# Patient Record
Sex: Male | Born: 2000 | Race: Black or African American | Hispanic: No | Marital: Single | Smoking: Never smoker
Health system: Southern US, Community
[De-identification: ages and names within clinical notes are randomized; demographics above are authoritative.]

## PROBLEM LIST (undated history)

## (undated) HISTORY — PX: OTHER SURGICAL HISTORY: SHX169

---
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
## 2020-11-08 IMAGING — CR DG ELBOW 2V*R*
1 series · 1 of 1 positions shown · non-contrast
Comparison: Earlier same day

CLINICAL DATA: Post reduction of right elbow dislocation.

EXAM:
RIGHT ELBOW - 2 VIEW

[lat]
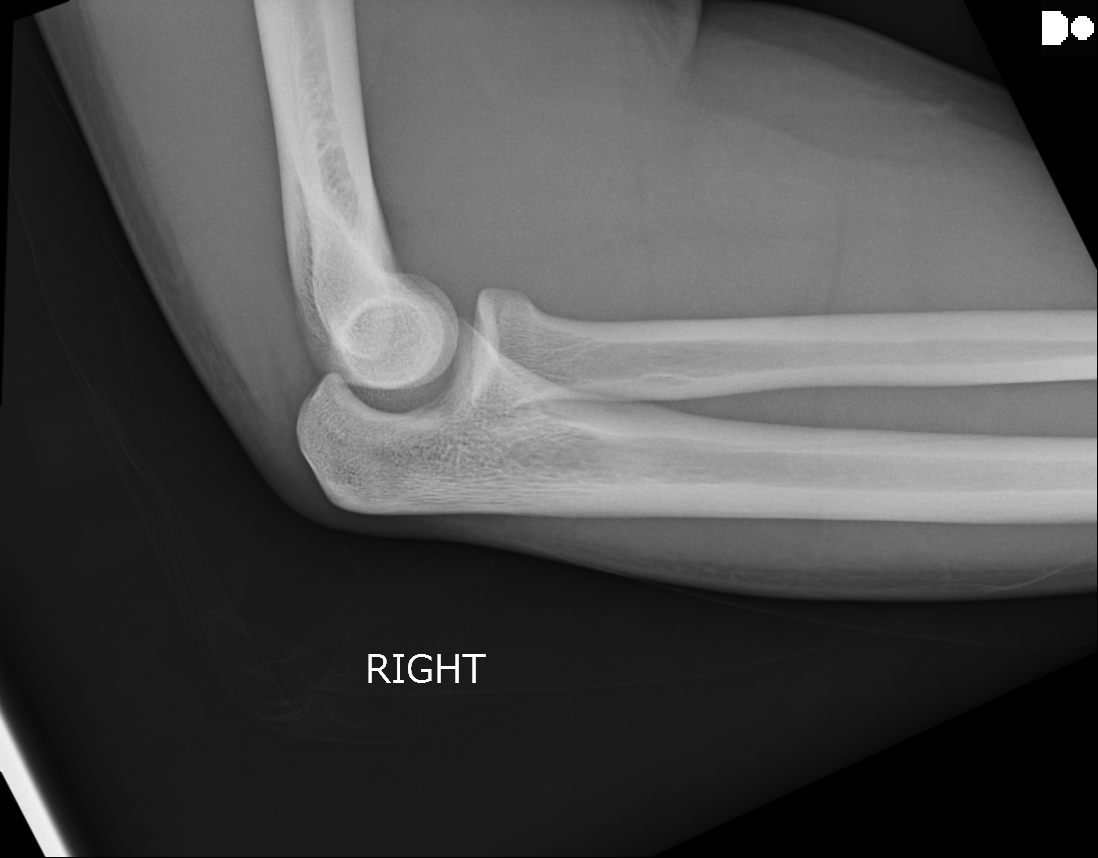

[1 of 1 positions shown; findings below may reference images not displayed]

FINDINGS: Interval reduction of previously identified elbow dislocation
without definite evidence of fracture given solitary lateral
projection. No elbow joint effusion. Alignment appears anatomic with
normal orientation of the anterior humeral line as well as the
radiocapitellar articulation. Regional soft tissues appear normal.
IMPRESSION: Interval reduction of right elbow dislocation without evidence of
fracture or elbow joint effusion.

## 2020-11-08 IMAGING — DX DG ELBOW 2V*R*
2 series · 2 of 2 positions shown · non-contrast
Comparison: None.

CLINICAL DATA: Person fell on arm.  Deformity.

EXAM:
RIGHT ELBOW - 2 VIEW

[elbow ap]
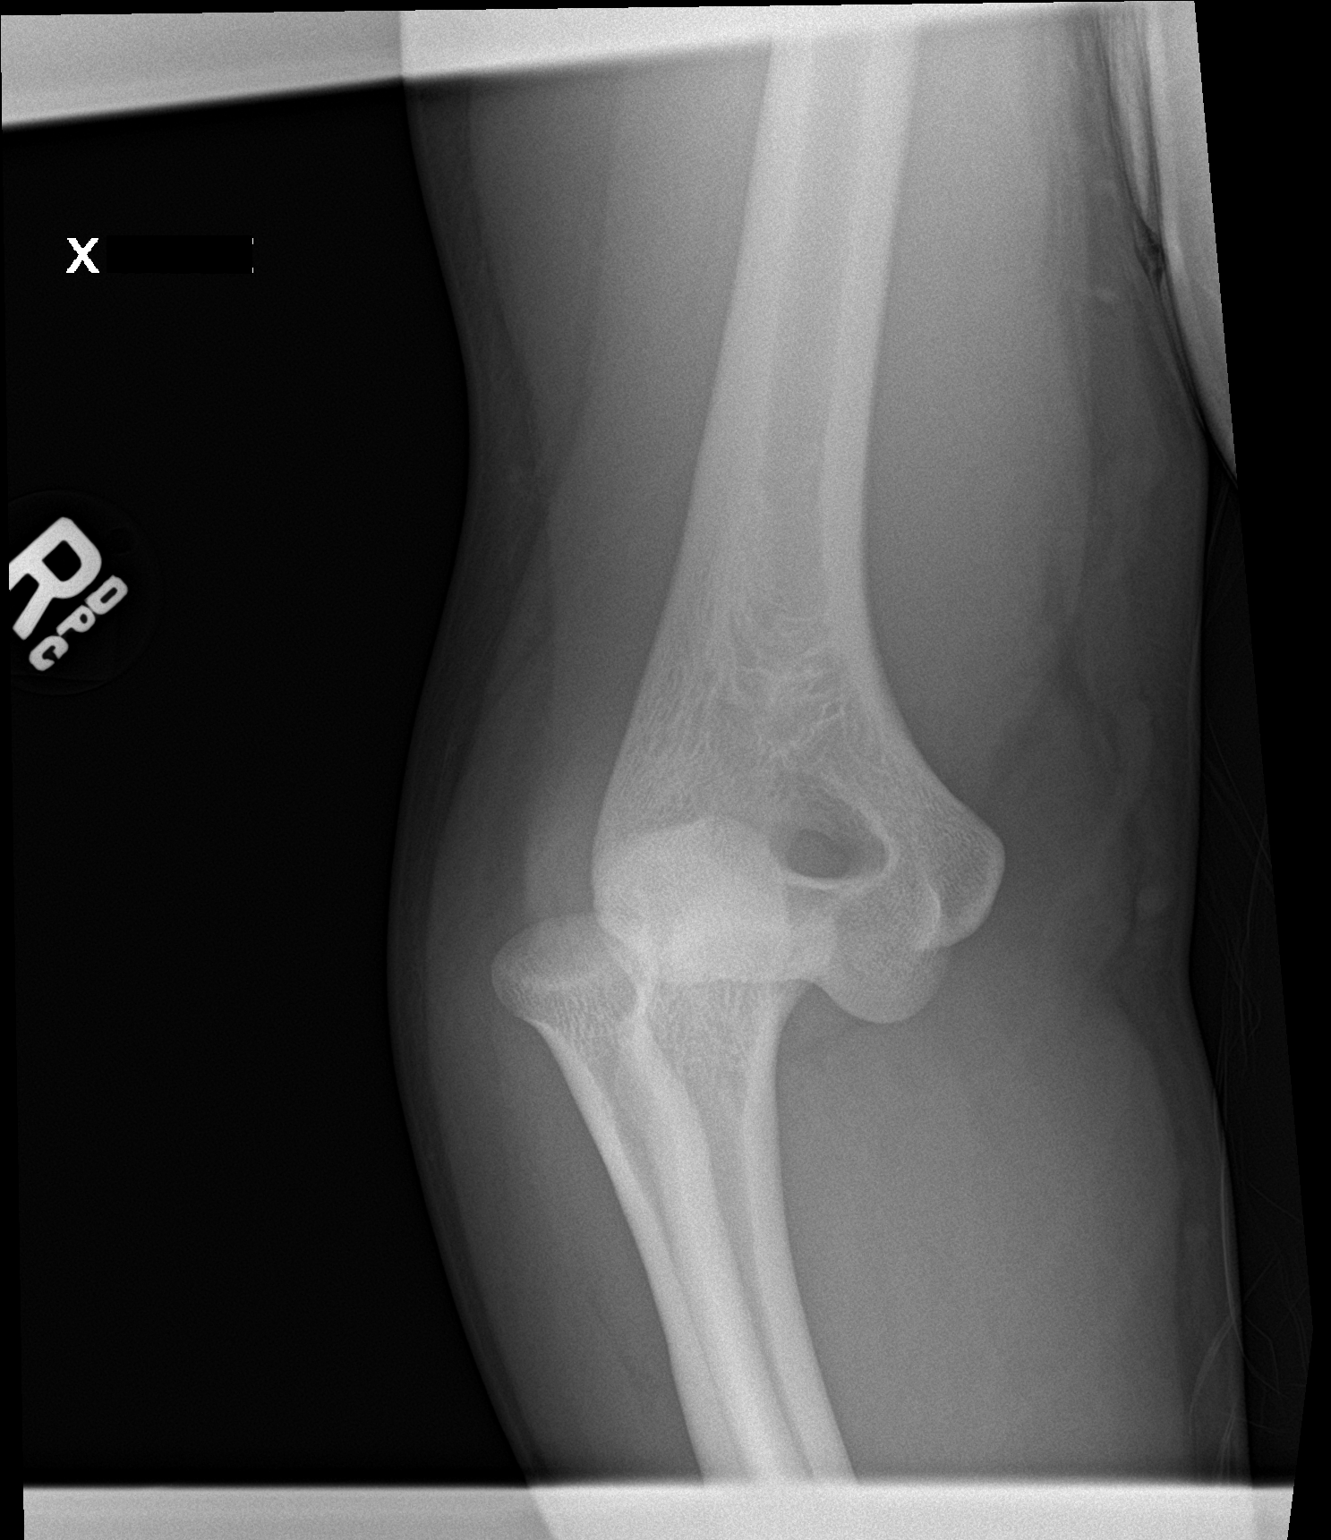

[elbow lat]
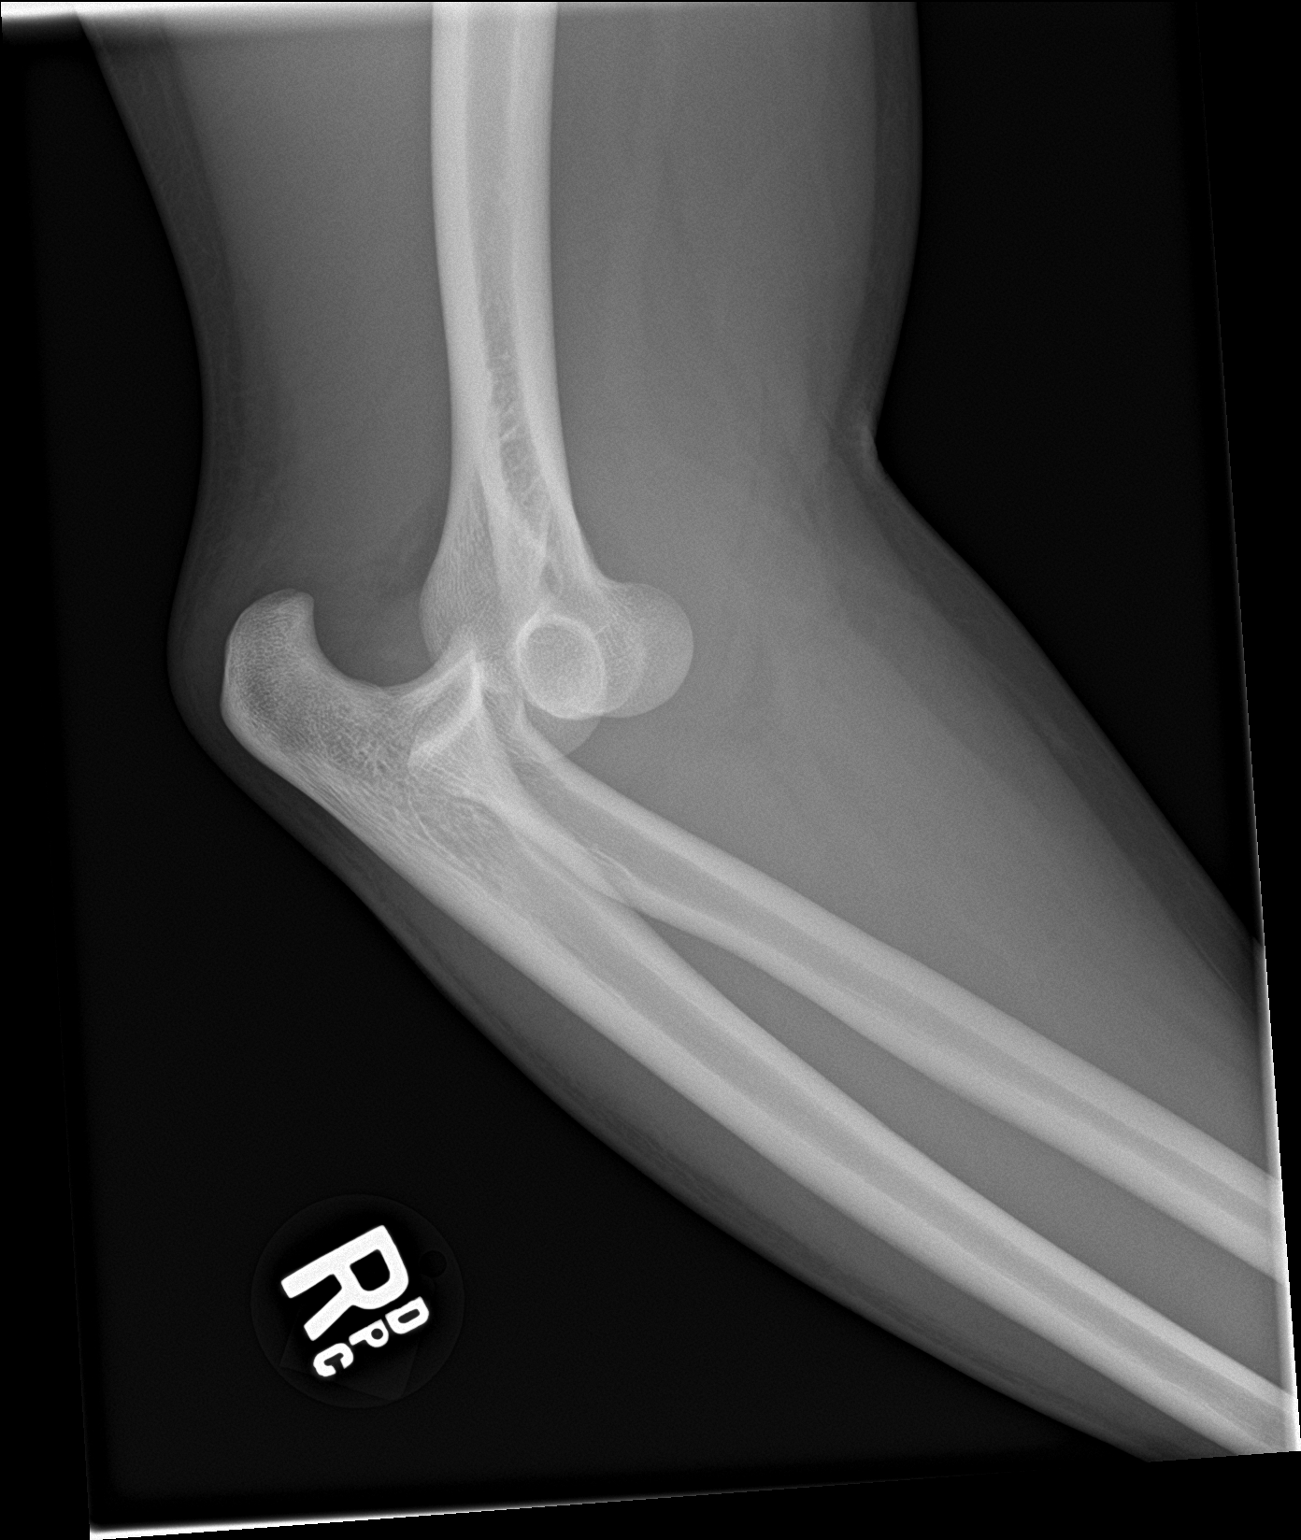

[2 of 2 positions shown; findings below may reference images not displayed]

FINDINGS: Elbow dislocation with posterior displacement of the radius and
ulna. No fracture deformity. No destructive bony lesions. Small
elbow effusion.
IMPRESSION: Elbow dislocation without fracture deformity.

## 2021-04-18 ENCOUNTER — Encounter: Payer: Self-pay | Admitting: Pediatrics

## 2021-10-14 ENCOUNTER — Emergency Department (HOSPITAL_COMMUNITY)
Admission: EM | Admit: 2021-10-14 | Discharge: 2021-10-14 | Disposition: A | Discharge status: Home / Self Care | Payer: Medicaid Other | Attending: Emergency Medicine | Admitting: Emergency Medicine

## 2021-10-14 ENCOUNTER — Encounter (HOSPITAL_COMMUNITY): Payer: Self-pay

## 2021-10-14 ENCOUNTER — Other Ambulatory Visit: Payer: Self-pay

## 2021-10-14 ENCOUNTER — Emergency Department (HOSPITAL_COMMUNITY): Payer: Medicaid Other

## 2021-10-14 DIAGNOSIS — S76302A Unspecified injury of muscle, fascia and tendon of the posterior muscle group at thigh level, left thigh, initial encounter: Secondary | ICD-10-CM | POA: Diagnosis not present

## 2021-10-14 DIAGNOSIS — Y9372 Activity, wrestling: Secondary | ICD-10-CM | POA: Insufficient documentation

## 2021-10-14 DIAGNOSIS — W2189XA Striking against or struck by other sports equipment, initial encounter: Secondary | ICD-10-CM | POA: Insufficient documentation

## 2021-10-14 DIAGNOSIS — S8992XA Unspecified injury of left lower leg, initial encounter: Secondary | ICD-10-CM | POA: Diagnosis present

## 2021-10-14 MED ORDER — IBUPROFEN 800 MG PO TABS: 800.0000 mg | ORAL_TABLET | Freq: Three times a day (TID) | ORAL | 1 refills | Status: AC | PRN
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
## 2021-10-14 NOTE — ED Provider Notes (Signed)
[facility] EMERGENCY DEPARTMENT Provider Note   CSN: [id number] Arrival date & time: 10/14/21  2050     History  Chief Complaint  Patient presents with   Knee Pain    "From wrestling at home"    Luis Sosa is a 21 y.o. male.  Patient was wrestling and injured his left hamstring.  No past medical history  The history is provided by the patient and medical records. No language interpreter was used.  Knee Pain Lower extremity pain location: Left leg. Injury: yes   Pain details:    Quality:  Aching   Radiates to:  Does not radiate   Severity:  Moderate   Timing:  Constant   Progression:  Worsening Chronicity:  New Dislocation: no   Foreign body present:  No foreign bodies Associated symptoms: no back pain and no fatigue       Home Medications Prior to Admission medications   Medication Sig Start Date End Date Taking? Authorizing Provider  ibuprofen (ADVIL) 800 MG tablet Take 1 tablet (800 mg total) by mouth every 8 (eight) hours as needed for moderate pain. 10/14/21  Yes Milton Ferguson, MD  HYDROcodone-acetaminophen (NORCO/VICODIN) 5-325 MG tablet Take 1 tablet by mouth every 4 (four) hours as needed. 09/18/18   Isla Pence, MD      Allergies    Patient has no known allergies.    Review of Systems   Review of Systems  Constitutional:  Negative for appetite change and fatigue.  HENT:  Negative for congestion, ear discharge and sinus pressure.   Eyes:  Negative for discharge.  Respiratory:  Negative for cough.   Cardiovascular:  Negative for chest pain.  Gastrointestinal:  Negative for abdominal pain and diarrhea.  Genitourinary:  Negative for frequency and hematuria.  Musculoskeletal:  Negative for back pain.       Left leg pain  Skin:  Negative for rash.  Neurological:  Negative for seizures and headaches.  Psychiatric/Behavioral:  Negative for hallucinations.    Physical Exam Updated Vital Signs BP 100/73 (BP Location: Right Arm)    Pulse (!) 123     Temp 98.8 F (37.1 C) (Oral)    Resp 18    Ht 5\' 7"  (1.702 m)    Wt 90.3 kg    SpO2 97%    BMI 31.17 kg/m  Physical Exam Vitals and nursing note reviewed.  Constitutional:      Appearance: He is well-developed.  HENT:     Head: Normocephalic.     Mouth/Throat:     Mouth: Mucous membranes are moist.  Eyes:     Conjunctiva/sclera: Conjunctivae normal.  Neck:     Trachea: No tracheal deviation.  Cardiovascular:     Rate and Rhythm: Normal rate.     Heart sounds: No murmur heard. Pulmonary:     Effort: Pulmonary effort is normal.  Musculoskeletal:        General: Normal range of motion.     Comments: Pain in left hamstring  Skin:    General: Skin is warm.  Neurological:     Mental Status: He is alert and oriented to person, place, and time.    ED Results / Procedures / Treatments   Labs (all labs ordered are listed, but only abnormal results are displayed) Labs Reviewed - No data to display  EKG None  Radiology DG Knee Complete 4 Views Left  Result Date: 10/14/2021 CLINICAL DATA:  Injury EXAM: LEFT KNEE - COMPLETE 4+ VIEW COMPARISON:  None. FINDINGS:  No evidence of fracture, dislocation, or joint effusion. No evidence of arthropathy or other focal bone abnormality. Soft tissues are unremarkable. IMPRESSION: Negative. Electronically Signed   By: Donavan Foil M.D.   On: 10/14/2021 21:33    Procedures Procedures    Medications Ordered in ED Medications - No data to display  ED Course/ Medical Decision Making/ A&P                           Medical Decision Making  Patient with strain left hamstring.  He is given crutches and Motrin will follow-up with a family doctor or an orthopedic This patient presents to the ED for concern of left leg pain, this involves an extensive number of treatment options, and is a complaint that carries with it a high risk of complications and morbidity.  The differential diagnosis includes injury left hamstring, fractured femur, blood clot  in leg,   Co morbidities that complicate the patient evaluation  None   Additional history obtained:  Additional history obtained from patient External records from outside source obtained and reviewed including no hospital records   Lab Tests:  I Ordered, and personally interpreted labs.  The pertinent results include: A lab test   Imaging Studies ordered:  I ordered imaging studies including x-ray of femur I independently visualized and interpreted imaging which showed unremarkable not on the monitor I agree with the radiologist interpretation   Cardiac Monitoring:  No monitor Medicines ordered and prescription drug management:  I ordered medication including Motrin and Vicodin for pain Reevaluation of the patient after these medicines showed that the patient improved I have reviewed the patients home medicines and have made adjustments as needed   Test Considered:  MRI of the leg   Critical Interventions:  None   Consultations Obtained:  No consult  Problem List / ED Course:  M string injury   Reevaluation:  After the interventions noted above, I reevaluated the patient and found that they have :stayed the same   Social Determinants of Health:  None   Dispostion:  After consideration of the diagnostic results and the patients response to treatment, I feel that the patent would benefit from discharge home with Motrin and follow-up with orthopedics.  Patient given crutches.         Final Clinical Impression(s) / ED Diagnoses Final diagnoses:  Left hamstring injury, initial encounter    Rx / DC Orders ED Discharge Orders          Ordered    ibuprofen (ADVIL) 800 MG tablet  Every 8 hours PRN        10/14/21 2223              Milton Ferguson, MD 10/16/21 1034

## 2021-10-14 NOTE — ED Triage Notes (Signed)
Pt able to extend knee in triage while sitting in WC.

## 2021-10-14 NOTE — Discharge Instructions (Signed)
Use the crutches to ambulate with.  Follow-up next week with either your family doctor or an orthopedic doctor.  Take the Motrin for pain

## 2021-10-14 NOTE — ED Notes (Signed)
Pt d/c home per MD order. Discharge summary reviewed, pt verbalizes understanding. Ambulatory off unit with crutch assist. No s/s of acute distress noted at discharge.

## 2021-10-14 NOTE — ED Notes (Signed)
Patient transported to X-ray
